# Patient Record
Sex: Female | Born: 2000 | Race: Black or African American | Hispanic: No | Marital: Single | Smoking: Never smoker
Health system: Southern US, Community
[De-identification: ages and names within clinical notes are randomized; demographics above are authoritative.]

## PROBLEM LIST (undated history)

## (undated) DIAGNOSIS — J302 Other seasonal allergic rhinitis: Secondary | ICD-10-CM

## (undated) DIAGNOSIS — B9689 Other specified bacterial agents as the cause of diseases classified elsewhere: Secondary | ICD-10-CM

## (undated) HISTORY — PX: ROOT CANAL: SHX2363

---
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
## 2018-01-11 ENCOUNTER — Encounter: Payer: Self-pay | Admitting: Internal Medicine

## 2018-01-11 ENCOUNTER — Encounter (INDEPENDENT_AMBULATORY_CARE_PROVIDER_SITE_OTHER): Payer: Self-pay

## 2018-01-11 ENCOUNTER — Ambulatory Visit (INDEPENDENT_AMBULATORY_CARE_PROVIDER_SITE_OTHER): Payer: No Typology Code available for payment source | Admitting: Internal Medicine

## 2018-01-11 ENCOUNTER — Other Ambulatory Visit: Payer: Self-pay

## 2018-01-11 VITALS — BP 122/51 | HR 61 | Temp 98.3°F | Ht 64.0 in | Wt 143.4 lb

## 2018-01-11 DIAGNOSIS — Z025 Encounter for examination for participation in sport: Secondary | ICD-10-CM | POA: Diagnosis not present

## 2018-01-11 DIAGNOSIS — J302 Other seasonal allergic rhinitis: Secondary | ICD-10-CM

## 2018-01-11 NOTE — Progress Notes (Addendum)
   CC: Physical Exam  HPI:  [name] May is a 17 y.o. 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 She denies any past medical history, injuries or childhood illnesses. She has no complaints or symptoms. She is in her second year of college. She denies any drug or alcohol use. She has never smoked tobacco products. She stated her periods are normal once a month and she does not have heavy bleeding. She takes zyrtec for her allergies PRN.    Past Medical History:  Diagnosis Date  . Premature baby    born at 32 weeks, 1 week NICU stay  . Seasonal allergies    Review of Systems:  Review of Systems  Constitutional: Negative for chills, fever and weight loss.  HENT: Negative for ear pain, hearing loss and sore throat.   Eyes: Negative for blurred vision.  Respiratory: Negative for cough, shortness of breath and wheezing.   Cardiovascular: Negative for chest pain and palpitations.  Gastrointestinal: Negative for abdominal pain and nausea.  Genitourinary: Negative for dysuria, frequency and urgency.  Musculoskeletal: Negative for joint pain and myalgias.  Skin: Negative for rash.  Neurological: Negative for weakness and headaches.    Physical Exam:  Vitals:   01/11/18 1514  BP: (!) 122/51  Pulse: 61  Temp: 98.3 F (36.8 C)  TempSrc: Oral  SpO2: 100%  Weight: 65 kg (143 lb 6.4 oz)  Height: 5\' 4"  (1.626 m)   Physical Exam  Constitutional: She is oriented to person, place, and time and well-developed, well-nourished, and in no distress.  HENT:  Head: Normocephalic and atraumatic.  Right Ear: External ear normal.  Left Ear: External ear normal.  Nose: Nose normal.  Eyes: Pupils are equal, round, and reactive to light. Conjunctivae and EOM are normal.  Neck: Normal range of motion. No thyromegaly present.  Cardiovascular: Normal rate, regular rhythm, normal heart sounds and intact distal pulses. Exam reveals no gallop.  No murmur  heard. Pulmonary/Chest: Effort normal and breath sounds normal. No respiratory distress. She has no wheezes.  Abdominal: Soft. Bowel sounds are normal. She exhibits no distension.  Musculoskeletal: Normal range of motion.  Lymphadenopathy:    She has no cervical adenopathy.  Neurological: She is alert and oriented to person, place, and time. Gait normal.  Skin: No rash noted.  Psychiatric: Affect and judgment normal.    Assessment & Plan:   Ms. Tanya May May is an 17 y.o female with a normal physical exam, with no active problems. She will have labs and HIV screening at her next visit.   Patient was discussed with Dr. Cyndie ChimeGranfortuna.

## 2018-01-11 NOTE — Addendum Note (Signed)
Addended by: Jaci StandardEHMAN, Rue Tinnel N on: 01/11/2018 04:40 PM   Modules accepted: Level of Service

## 2018-01-11 NOTE — Progress Notes (Signed)
Medicine attending: Medical history, presenting problems, physical findings, and medications, reviewed with resident physician Dr Areeg Rehman Lerry Lineron the day of the patient visit and I concur with her evaluation and management plan. Due to a communication error, patient left office before I could see her. Healthy 17 y/o woman here for a routine exam. She uses as needed antihistamines for seasonal allergies. No other med/surg problems. Normal exam per Dr Karilyn Cotaehman. She will return for screening labs.

## 2018-01-20 ENCOUNTER — Telehealth: Payer: Self-pay | Admitting: Internal Medicine

## 2018-02-09 ENCOUNTER — Ambulatory Visit: Payer: No Typology Code available for payment source

## 2018-03-20 ENCOUNTER — Encounter (HOSPITAL_COMMUNITY): Payer: Self-pay | Admitting: *Deleted

## 2018-03-20 ENCOUNTER — Inpatient Hospital Stay (HOSPITAL_COMMUNITY)
Admission: AD | Admit: 2018-03-20 | Discharge: 2018-03-20 | Disposition: A | Discharge status: Home / Self Care | Payer: No Typology Code available for payment source | Source: Ambulatory Visit | Attending: Obstetrics & Gynecology | Admitting: Obstetrics & Gynecology

## 2018-03-20 ENCOUNTER — Other Ambulatory Visit: Payer: Self-pay

## 2018-03-20 DIAGNOSIS — N760 Acute vaginitis: Secondary | ICD-10-CM | POA: Diagnosis not present

## 2018-03-20 DIAGNOSIS — B9689 Other specified bacterial agents as the cause of diseases classified elsewhere: Secondary | ICD-10-CM | POA: Diagnosis not present

## 2018-03-20 DIAGNOSIS — Z880 Allergy status to penicillin: Secondary | ICD-10-CM | POA: Insufficient documentation

## 2018-03-20 DIAGNOSIS — N898 Other specified noninflammatory disorders of vagina: Secondary | ICD-10-CM | POA: Diagnosis present

## 2018-03-20 HISTORY — DX: Other specified bacterial agents as the cause of diseases classified elsewhere: B96.89

## 2018-03-20 LAB — URINALYSIS, ROUTINE W REFLEX MICROSCOPIC
Bilirubin Urine: NEGATIVE
GLUCOSE, UA: NEGATIVE mg/dL
HGB URINE DIPSTICK: NEGATIVE
Ketones, ur: NEGATIVE mg/dL
Leukocytes, UA: NEGATIVE
Nitrite: NEGATIVE
PROTEIN: NEGATIVE mg/dL
Specific Gravity, Urine: 1.029 (ref 1.005–1.030)
pH: 5 (ref 5.0–8.0)

## 2018-03-20 LAB — WET PREP, GENITAL
Sperm: NONE SEEN
Trich, Wet Prep: NONE SEEN
Yeast Wet Prep HPF POC: NONE SEEN

## 2018-03-20 LAB — POCT PREGNANCY, URINE: Preg Test, Ur: NEGATIVE

## 2018-03-20 MED ORDER — METRONIDAZOLE 0.75 % VA GEL: 1.0000 | Freq: Every day | VAGINAL | 0 refills | Status: AC

## 2018-03-20 NOTE — MAU Provider Note (Signed)
History     CSN: [id number]  Arrival date and time: 03/20/18 1316   First Provider Initiated Contact with Patient 03/20/18 1616      Chief Complaint  Patient presents with  . Vaginal Discharge   HPI  Ms.  Tanya May is a 17 y.o. year old G0P0000 female at Unknown weeks gestation who presents to MAU reporting she thinks she has BV or yeast infection. She reports she was given an abx for an eye infection at the end of July. Since then she has had a vaginal d/c that she tried to treat with OTC yeast creams without relief. She reports that now the vaginal d/c has an odor. She denies abdominal pain, VB urinary sx's, no new sexual partners.   Past Medical History:  Diagnosis Date  . Premature baby    born at 32 weeks, 1 week NICU stay  . Seasonal allergies     Past Surgical History:  Procedure Laterality Date  . ROOT CANAL      Family History  Problem Relation Age of Onset  . Diabetes Maternal Grandmother   . Heart disease Maternal Grandmother   . Diabetes Maternal Grandfather   . Stroke Maternal Aunt     Social History   Tobacco Use  . Smoking status: Never Smoker  . Smokeless tobacco: Never Used  Substance Use Topics  . Alcohol use: No  . Drug use: No    Allergies:  Allergies  Allergen Reactions  . Adhesive [Tape] Itching    Please use paper tape  . Amoxicillin Hives    Reaction at 17 yrs old Has patient had a PCN reaction causing immediate rash, facial/tongue/throat swelling, SOB or lightheadedness with hypotension: Yes Has patient had a PCN reaction causing severe rash involving mucus membranes or skin necrosis: No Has patient had a PCN reaction that required hospitalization: No Has patient had a PCN reaction occurring within the last 10 years: Yes If all of the above answers are "NO", then may proceed with Cephalosporin use.   . Latex Rash  . Penicillins Rash    Has patient had a PCN reaction causing immediate rash, facial/tongue/throat swelling,  SOB or lightheadedness with hypotension: Yes Has patient had a PCN reaction causing severe rash involving mucus membranes or skin necrosis: No Has patient had a PCN reaction that required hospitalization No Has patient had a PCN reaction occurring within the last 10 years: Yes If all of the above answers are "NO", then may proceed with Cephalosporin use.    Medications Prior to Admission  Medication Sig Dispense Refill Last Dose  . cetirizine (ZYRTEC) 10 MG tablet Take 10 mg by mouth at bedtime.   10/13/2017 at Unknown time  . CVS BIOTIN PO Take 1 capsule by mouth daily.   10/13/2017 at Unknown time  . meclizine (ANTIVERT) 25 MG tablet Take 1 tablet (25 mg total) by mouth 3 (three) times daily as needed for dizziness. 30 tablet 0   . norethindrone-ethinyl estradiol (JUNEL FE,GILDESS FE,LOESTRIN FE) 1-20 MG-MCG tablet Take 1 tablet by mouth daily.   08/22/2017 at Unknown time    Review of Systems  Constitutional: Negative.   HENT: Negative.   Eyes: Negative.   Respiratory: Negative.   Cardiovascular: Negative.   Gastrointestinal: Negative.   Endocrine: Negative.   Genitourinary: Positive for vaginal discharge (increased and odor).  Musculoskeletal: Negative.   Skin: Negative.   Allergic/Immunologic: Negative.   Neurological: Negative.   Hematological: Negative.   Psychiatric/Behavioral: Negative.  Physical Exam   Blood pressure (!) 104/59, pulse 81, temperature 98.4 F (36.9 C), temperature source Oral, resp. rate 16, weight 62.3 kg, last menstrual period 03/05/2018, SpO2 98 %.  Physical Exam  Nursing note and vitals reviewed. Constitutional: She is oriented to person, place, and time. She appears well-developed and well-nourished.  HENT:  Head: Normocephalic and atraumatic.  Eyes: Pupils are equal, round, and reactive to light.  Neck: Normal range of motion.  Cardiovascular: Normal rate, regular rhythm and normal heart sounds.  Respiratory: Effort normal and breath sounds  normal.  GI: Soft. Bowel sounds are normal.  Genitourinary:  Genitourinary Comments: Uterus: non-tender, SE: cervix is smooth, pink, no lesions, moderate amt of malodorous, thin, white vaginal d/c -- WP, GC/CT done, closed/long/firm, no CMT or friability, no adnexal tenderness   Musculoskeletal: Normal range of motion.  Neurological: She is alert and oriented to person, place, and time.  Skin: Skin is warm and dry.  Psychiatric: She has a normal mood and affect. Her behavior is normal. Judgment and thought content normal.    MAU Course  Procedures  MDM CCUA UPT Wet Prep GC/CT -- pending  Results for orders placed or performed during the hospital encounter of 03/20/18 (from the past 24 hour(s))  Urinalysis, Routine w reflex microscopic     Status: Abnormal   Collection Time: 03/20/18  2:09 PM  Result Value Ref Range   Color, Urine YELLOW YELLOW   APPearance CLOUDY (A) CLEAR   Specific Gravity, Urine 1.029 1.005 - 1.030   pH 5.0 5.0 - 8.0   Glucose, UA NEGATIVE NEGATIVE mg/dL   Hgb urine dipstick NEGATIVE NEGATIVE   Bilirubin Urine NEGATIVE NEGATIVE   Ketones, ur NEGATIVE NEGATIVE mg/dL   Protein, ur NEGATIVE NEGATIVE mg/dL   Nitrite NEGATIVE NEGATIVE   Leukocytes, UA NEGATIVE NEGATIVE  Pregnancy, urine POC     Status: None   Collection Time: 03/20/18  2:13 PM  Result Value Ref Range   Preg Test, Ur NEGATIVE NEGATIVE  Wet prep, genital     Status: Abnormal   Collection Time: 03/20/18  4:25 PM  Result Value Ref Range   Yeast Wet Prep HPF POC NONE SEEN NONE SEEN   Trich, Wet Prep NONE SEEN NONE SEEN   Clue Cells Wet Prep HPF POC PRESENT (A) NONE SEEN   WBC, Wet Prep HPF POC MODERATE (A) NONE SEEN   Sperm NONE SEEN     Assessment and Plan  Bacterial vaginitis - Plan: Discharge patient - Rx for Metrogel 0.75% cream pv hs x 5 days - Information provided on BV - 2020 MAU GYN changes information provided - [place] [facility] provider list given - Contact info on [facility] provided for her to call for further GYN needs  - Patient verbalized an understanding of the plan of care and agrees.   Raelyn Moraolitta Haizlee Henton, MSN, CNM 03/20/2018, 4:16 PM

## 2018-03-20 NOTE — Discharge Instructions (Signed)
In late February 2020, the [facility] will be [facility]. At that time, the MAU (Maternity Admissions Unit), where you are being seen today, will no longer take care of non-pregnant patients. We strongly encourage you to find a doctor's office before that time, so that you can be seen with any GYN concerns, like vaginal discharge, urinary tract infection, etc.. in a timely manner.  In order to make an office visit more convenient, the Center for [facility] at [facility] will be offering evening hours with same-day appointments, walk-in appointments and scheduled appointments available during this time.  Center for [facility] @ [facility] Hours: Monday - 8am - 7:30 pm with walk-in between 4pm- 7:30 pm Tuesday - 8 am - 5 pm open late and accepting walk-ins from 4pm - 7:30pm Wednesday - 8 am - 5 pm open late and accepting walk-ins from 4pm - 7:30pm Thursday 8 am - 5 pm  Friday 8 am - 5 pm  For an appointment please call the Center for [facility] @ [facility] at [phone]  For urgent needs, [facility] is also available for management of urgent GYN complaints such as vaginal discharge or urinary tract infections.   [facility]    [facility] for [facility]       Phone: [phone]  [facility] [place] Phone: [phone]  [facility] [place]  Phone: [phone]  [facility]  Phone: [phone]  Center for [facility] [place]  Phone: [phone]  [facility]                  Phone: [phone]  [facility]       Phone: [phone]  [facility]    Phone: [phone]   [facility] [place]    Phone: [phone]  [facility]    Phone:  [phone]  [facility]    Phone: [phone]  [facility]    Phone: [phone]  [facility]       Phone: [phone]   [facility]  Phone: [phone]  [facility]    Phone: [phone]  [facility]   Phone: [phone]  [facility]      Phone: [phone]  [facility]    Phone: [phone]

## 2018-03-20 NOTE — MAU Note (Signed)
Was given antibiotics for an eye infection.  Thinks it caused a yeast infection.  Tried home remedies, now is thinking it may be a bacterial infections. Having a d/c with odor

## 2018-03-21 LAB — GC/CHLAMYDIA PROBE AMP (~~LOC~~) NOT AT ARMC
Chlamydia: NEGATIVE
Neisseria Gonorrhea: NEGATIVE

## 2018-11-13 ENCOUNTER — Other Ambulatory Visit: Payer: Self-pay

## 2018-11-13 ENCOUNTER — Ambulatory Visit (INDEPENDENT_AMBULATORY_CARE_PROVIDER_SITE_OTHER): Payer: Managed Care, Other (non HMO) | Admitting: Internal Medicine

## 2018-11-13 VITALS — BP 109/56 | HR 66 | Temp 98.4°F | Wt 144.7 lb

## 2018-11-13 DIAGNOSIS — R350 Frequency of micturition: Secondary | ICD-10-CM | POA: Diagnosis not present

## 2018-11-13 DIAGNOSIS — R3915 Urgency of urination: Secondary | ICD-10-CM | POA: Diagnosis not present

## 2018-11-13 LAB — POCT URINALYSIS DIPSTICK
Bilirubin, UA: NEGATIVE
Blood, UA: NEGATIVE
Glucose, UA: NEGATIVE
Ketones, UA: NEGATIVE
Nitrite, UA: NEGATIVE
Protein, UA: NEGATIVE
Spec Grav, UA: >=1.03 — AB (ref 1.010–1.025)
Urobilinogen, UA: 1 E.U./dL
pH, UA: 6 (ref 5.0–8.0)

## 2018-11-13 MED ORDER — SULFAMETHOXAZOLE-TRIMETHOPRIM 800-160 MG PO TABS: 1.0000 | ORAL_TABLET | Freq: Two times a day (BID) | ORAL | 0 refills | Status: AC

## 2018-11-13 NOTE — Progress Notes (Signed)
   CC: Increased urinary urgency and frequency for few days.  HPI:  Tanya May is a 18 y.o. with no significant past medical history came to the clinic with complaint of increased urinary urgency and frequency for the past few days.  She denies any abdominal pain, fever or chills.  Denies any vaginal discharge.  Denies any nocturia.  Denies any polydipsia.  She is having regular cycle, LMP was on November 02, 2018.  She is not sexually active at this time.  Past Medical History:  Diagnosis Date  . Premature baby    born at 32 weeks, 1 week NICU stay  . Seasonal allergies    Review of Systems: Negative except mentioned in HPI.  Physical Exam:  Vitals:   11/13/18 1018  BP: (!) 109/56  Pulse: 66  Temp: 98.4 F (36.9 C)  TempSrc: Oral  Weight: 144 lb 11.2 oz (65.6 kg)   General: Vital signs reviewed.  Patient is well-developed and well-nourished, in no acute distress and cooperative with exam.  Head: Normocephalic and atraumatic. Eyes: EOMI, conjunctivae normal, no scleral icterus.  Cardiovascular: RRR, S1 normal, S2 normal, no murmurs, gallops, or rubs. Pulmonary/Chest: Clear to auscultation bilaterally, no wheezes, rales, or rhonchi. Abdominal: Soft, non-tender, non-distended, BS +,  Extremities: No lower extremity edema bilaterally,  pulses symmetric and intact bilaterally. No cyanosis or clubbing. Skin: Warm, dry and intact. No rashes or erythema. Psychiatric: Normal mood and affect. speech and behavior is normal. Cognition and memory are normal.  Assessment & Plan:   See Encounters Tab for problem based charting.  Patient discussed with Dr. Cyndie Chime.

## 2018-11-13 NOTE — Patient Instructions (Addendum)
Thank you for visiting clinic today. It seems like you might be having irritation/infection of your bladder called cystitis. I am giving you a 3-day course of Bactrim you will take it twice a day for 3 days and see if that will improve your symptoms. Please call our clinic if your symptoms fail to resolve or get worse or if you develop fever or pain.    Urinary Tract Infection, Adult  A urinary tract infection (UTI) is an infection of any part of the urinary tract. The urinary tract includes the kidneys, ureters, bladder, and urethra. These organs make, store, and get rid of urine in the body. Your health care provider may use other names to describe the infection. An upper UTI affects the ureters and kidneys (pyelonephritis). A lower UTI affects the bladder (cystitis) and urethra (urethritis). What are the causes? Most urinary tract infections are caused by bacteria in your genital area, around the entrance to your urinary tract (urethra). These bacteria grow and cause inflammation of your urinary tract. What increases the risk? You are more likely to develop this condition if:  You have a urinary catheter that stays in place (indwelling).  You are not able to control when you urinate or have a bowel movement (you have incontinence).  You are female and you: ? Use a spermicide or diaphragm for birth control. ? Have low estrogen levels. ? Are pregnant.  You have certain genes that increase your risk (genetics).  You are sexually active.  You take antibiotic medicines.  You have a condition that causes your flow of urine to slow down, such as: ? An enlarged prostate, if you are female. ? Blockage in your urethra (stricture). ? A kidney stone. ? A nerve condition that affects your bladder control (neurogenic bladder). ? Not getting enough to drink, or not urinating often.  You have certain medical conditions, such as: ? Diabetes. ? A weak disease-fighting system  (immunesystem). ? Sickle cell disease. ? Gout. ? Spinal cord injury. What are the signs or symptoms? Symptoms of this condition include:  Needing to urinate right away (urgently).  Frequent urination or passing small amounts of urine frequently.  Pain or burning with urination.  Blood in the urine.  Urine that smells bad or unusual.  Trouble urinating.  Cloudy urine.  Vaginal discharge, if you are female.  Pain in the abdomen or the lower back. You may also have:  Vomiting or a decreased appetite.  Confusion.  Irritability or tiredness.  A fever.  Diarrhea. The first symptom in older adults may be confusion. In some cases, they may not have any symptoms until the infection has worsened. How is this diagnosed? This condition is diagnosed based on your medical history and a physical exam. You may also have other tests, including:  Urine tests.  Blood tests.  Tests for sexually transmitted infections (STIs). If you have had more than one UTI, a cystoscopy or imaging studies may be done to determine the cause of the infections. How is this treated? Treatment for this condition includes:  Antibiotic medicine.  Over-the-counter medicines to treat discomfort.  Drinking enough water to stay hydrated. If you have frequent infections or have other conditions such as a kidney stone, you may need to see a health care provider who specializes in the urinary tract (urologist). In rare cases, urinary tract infections can cause sepsis. Sepsis is a life-threatening condition that occurs when the body responds to an infection. Sepsis is treated in the hospital with  IV antibiotics, fluids, and other medicines. Follow these instructions at home:  Medicines  Take over-the-counter and prescription medicines only as told by your health care provider.  If you were prescribed an antibiotic medicine, take it as told by your health care provider. Do not stop using the antibiotic  even if you start to feel better. General instructions  Make sure you: ? Empty your bladder often and completely. Do not hold urine for long periods of time. ? Empty your bladder after sex. ? Wipe from front to back after a bowel movement if you are female. Use each tissue one time when you wipe.  Drink enough fluid to keep your urine pale yellow.  Keep all follow-up visits as told by your health care provider. This is important. Contact a health care provider if:  Your symptoms do not get better after 1-2 days.  Your symptoms go away and then return. Get help right away if you have:  Severe pain in your back or your lower abdomen.  A fever.  Nausea or vomiting. Summary  A urinary tract infection (UTI) is an infection of any part of the urinary tract, which includes the kidneys, ureters, bladder, and urethra.  Most urinary tract infections are caused by bacteria in your genital area, around the entrance to your urinary tract (urethra).  Treatment for this condition often includes antibiotic medicines.  If you were prescribed an antibiotic medicine, take it as told by your health care provider. Do not stop using the antibiotic even if you start to feel better.  Keep all follow-up visits as told by your health care provider. This is important. This information is not intended to replace advice given to you by your health care provider. Make sure you discuss any questions you have with your health care provider. Document Released: 03/31/2005 Document Revised: 12/29/2017 Document Reviewed: 12/29/2017 Elsevier Interactive Patient Education  2019 [employer]

## 2018-11-13 NOTE — Assessment & Plan Note (Signed)
Her symptoms were more consistent with uncomplicated cystitis.  Urine dipstick done in clinic shows small amount of leukocytes.  -Give her a 3-day course of Bactrim. -If her symptoms fail to resolve then she may need urine culture.

## 2018-11-14 NOTE — Progress Notes (Signed)
Medicine attending: Medical history, presenting problems, physical findings, and medications, reviewed with resident physician Dr Summaya Amin on the day of the patient visit and I concur with her evaluation and management plan. 

## 2018-12-31 ENCOUNTER — Encounter: Payer: Self-pay | Admitting: *Deleted

## 2019-02-20 ENCOUNTER — Ambulatory Visit: Payer: No Typology Code available for payment source | Admitting: Internal Medicine

## 2019-02-20 ENCOUNTER — Other Ambulatory Visit: Payer: Self-pay

## 2019-02-20 DIAGNOSIS — Z0000 Encounter for general adult medical examination without abnormal findings: Secondary | ICD-10-CM | POA: Insufficient documentation

## 2019-02-20 NOTE — Progress Notes (Signed)
   CC: Annual physical for school  HPI:  [name] is a 18 y.o. with medical history of sickle cell trait presenting to have an annual physical to return to band practice.  Please see problem based charting for further details  Past Medical History:  Diagnosis Date  . Bacterial vaginitis 03/20/2018  . Premature baby    born at 25 weeks, 1 week NICU stay  . Seasonal allergies    Review of Systems: As per HPI  Physical Exam:  Vitals:   02/20/19 1344  BP: 119/63  Pulse: 85  Temp: 98.3 F (36.8 C)  TempSrc: Oral  SpO2: 100%  Weight: 140 lb 11.2 oz (63.8 kg)  Height: 5\' 5"  (1.651 m)   Physical Exam  Constitutional: She is oriented to person, place, and time and well-developed, well-nourished, and in no distress.  HENT:  Head: Normocephalic and atraumatic.  Right Ear: External ear normal.  Left Ear: External ear normal.  Mouth/Throat: Oropharynx is clear and moist. No oropharyngeal exudate.  Eyes: Conjunctivae are normal.  Neck: Neck supple.  Cardiovascular: Normal rate, regular rhythm and normal heart sounds.  Pulmonary/Chest: Effort normal and breath sounds normal. No respiratory distress. She has no wheezes. She has no rales.  Abdominal: Soft. Bowel sounds are normal. She exhibits no distension. There is no abdominal tenderness.  Musculoskeletal: Normal range of motion.        General: No tenderness, deformity or edema.  Neurological: She is alert and oriented to person, place, and time. No cranial nerve deficit. Gait normal. Coordination normal.  Skin: Skin is warm. No rash noted. No erythema. No pallor.  Psychiatric: Mood and affect normal.    Assessment & Plan:   See Encounters Tab for problem based charting.  Patient discussed with Dr. Evette Doffing

## 2019-02-20 NOTE — Assessment & Plan Note (Signed)
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  She is here to request an annual physical performed prior to returning to band practice.  She is otherwise very healthy young woman with no significant past medical history.  She denies chest pain, shortness of breath, nausea, vomiting, headache, dizziness, lightheadedness.  She works at [employer] and denies alcohol use, cigarette use, illicit drug use or being sexually active.  She uses seatbelt when she drives and endorses eating healthy diet and exercising.  She denies depression or birth control use.  Plan: - Annual wellness form filled -Advised on seatbelt use, living a healthy lifestyle.

## 2019-02-20 NOTE — Patient Instructions (Signed)
Ms. Depaoli,   It was a pleasure taking care of you at the clinic today.  Overall you seem to be doing very well and I would encourage you to continue taking care of yourself.  Please adhere to all the advice that I provided you during the close of our encounter.  Take care! Dr. Eileen Stanford  Please call the internal medicine center clinic if you have any questions or concerns, we may be able to help and keep you from a long and expensive emergency room wait. Our clinic and after hours phone number is [phone], the best time to call is Monday through Friday 9 am to 4 pm but there is always someone available 24/7 if you have an emergency. If you need medication refills please notify your pharmacy one week in advance and they will send [place] a request.

## 2019-02-21 NOTE — Progress Notes (Signed)
Internal Medicine Clinic Attending  Case discussed with Dr. Agyei at the time of the visit.  We reviewed the resident's history and exam and pertinent patient test results.  I agree with the assessment, diagnosis, and plan of care documented in the resident's note.    

## 2019-03-13 ENCOUNTER — Other Ambulatory Visit: Payer: Self-pay

## 2019-03-13 ENCOUNTER — Other Ambulatory Visit: Payer: Self-pay | Admitting: Internal Medicine

## 2019-03-13 DIAGNOSIS — Z0000 Encounter for general adult medical examination without abnormal findings: Secondary | ICD-10-CM

## 2019-03-13 DIAGNOSIS — Z20822 Contact with and (suspected) exposure to covid-19: Secondary | ICD-10-CM

## 2019-03-15 LAB — NOVEL CORONAVIRUS, NAA: SARS-CoV-2, NAA: NOT DETECTED

## 2019-03-20 ENCOUNTER — Other Ambulatory Visit: Payer: Self-pay | Admitting: Registered"

## 2019-03-20 DIAGNOSIS — Z20822 Contact with and (suspected) exposure to covid-19: Secondary | ICD-10-CM

## 2019-03-22 LAB — NOVEL CORONAVIRUS, NAA: SARS-CoV-2, NAA: NOT DETECTED

## 2019-07-25 ENCOUNTER — Other Ambulatory Visit: Payer: Managed Care, Other (non HMO)

## 2019-07-26 ENCOUNTER — Other Ambulatory Visit: Payer: Managed Care, Other (non HMO)

## 2019-07-30 ENCOUNTER — Ambulatory Visit: Payer: Managed Care, Other (non HMO) | Attending: Internal Medicine

## 2019-07-30 DIAGNOSIS — Z20822 Contact with and (suspected) exposure to covid-19: Secondary | ICD-10-CM

## 2019-07-31 LAB — NOVEL CORONAVIRUS, NAA: SARS-CoV-2, NAA: NOT DETECTED

## 2019-10-04 ENCOUNTER — Ambulatory Visit: Payer: Managed Care, Other (non HMO) | Attending: Internal Medicine

## 2019-10-04 DIAGNOSIS — Z23 Encounter for immunization: Secondary | ICD-10-CM

## 2019-10-04 NOTE — Progress Notes (Signed)
   Covid-19 Vaccination Clinic  Name:  Tanya May    MRN: [id number] DOB: 04/04/2001  10/04/2019  Tanya May was observed post Covid-19 immunization for 15 minutes without incident. She was provided with Vaccine Information Sheet and instruction to access the V-Safe system.   Tanya May was instructed to call 911 with any severe reactions post vaccine: Marland Kitchen Difficulty breathing  . Swelling of face and throat  . A fast heartbeat  . A bad rash all over body  . Dizziness and weakness   Immunizations Administered    Name Date Dose VIS Date Route   Pfizer COVID-19 Vaccine 10/04/2019 11:10 AM 0.3 mL 06/15/2019 Intramuscular   Manufacturer: [employer], [employer]   Lot: [id number]   NDC: [address]-5

## 2019-10-10 ENCOUNTER — Other Ambulatory Visit: Payer: Managed Care, Other (non HMO)

## 2019-10-24 ENCOUNTER — Encounter: Payer: Self-pay | Admitting: *Deleted

## 2019-10-29 ENCOUNTER — Ambulatory Visit: Payer: Managed Care, Other (non HMO) | Attending: Internal Medicine

## 2019-10-29 DIAGNOSIS — Z23 Encounter for immunization: Secondary | ICD-10-CM

## 2019-10-29 NOTE — Progress Notes (Signed)
   Covid-19 Vaccination Clinic  Name:  KEVONNA NOLTE    MRN: [id number] DOB: 2001-04-09  10/29/2019  Ms. Danzy was observed post Covid-19 immunization for 15 minutes without incident. She was provided with Vaccine Information Sheet and instruction to access the V-Safe system.   Ms. Kawasaki was instructed to call 911 with any severe reactions post vaccine: Marland Kitchen Difficulty breathing  . Swelling of face and throat  . A fast heartbeat  . A bad rash all over body  . Dizziness and weakness   Immunizations Administered    Name Date Dose VIS Date Route   Pfizer COVID-19 Vaccine 10/29/2019 11:03 AM 0.3 mL 08/29/2018 Intramuscular   Manufacturer: [employer], [employer]   Lot: [id number]   NDC: [address]-2

## 2019-12-06 ENCOUNTER — Encounter: Payer: Managed Care, Other (non HMO) | Admitting: Internal Medicine

## 2019-12-13 ENCOUNTER — Encounter: Payer: Managed Care, Other (non HMO) | Admitting: Internal Medicine

## 2019-12-20 ENCOUNTER — Other Ambulatory Visit: Payer: Self-pay

## 2019-12-20 ENCOUNTER — Ambulatory Visit (INDEPENDENT_AMBULATORY_CARE_PROVIDER_SITE_OTHER): Payer: Managed Care, Other (non HMO) | Admitting: Internal Medicine

## 2019-12-20 ENCOUNTER — Encounter: Payer: Self-pay | Admitting: Internal Medicine

## 2019-12-20 VITALS — BP 106/51 | HR 83 | Temp 99.6°F | Ht 65.0 in | Wt 142.0 lb

## 2019-12-20 DIAGNOSIS — L810 Postinflammatory hyperpigmentation: Secondary | ICD-10-CM | POA: Diagnosis not present

## 2019-12-20 DIAGNOSIS — Z0000 Encounter for general adult medical examination without abnormal findings: Secondary | ICD-10-CM | POA: Diagnosis not present

## 2019-12-20 NOTE — Patient Instructions (Signed)
Tanya May,  It was nice meeting you today!  The rash over your left elbow is likely residual hyperpigmentation after an inflammatory dermatitis (or skin irritation). I am glad it is improving. If it returns, worsens, or spreads elsewhere, then please follow-up back in our office.  Your physical form was completed and given back to you today. Good luck with band in the fall!  Thank you for letting me be a part of your care!

## 2019-12-20 NOTE — Progress Notes (Signed)
   CC: annual physical exam  HPI:  [name] is a 19 y.o. F with past medical history as outlined below, who presents for a rash and an annual physical. Please see problem-based charting for additional information.  Past Medical History:  Diagnosis Date  . Bacterial vaginitis 03/20/2018  . Premature baby    born at 32 weeks, 1 week NICU stay  . Seasonal allergies    Review of Systems:   Review of Systems  Constitutional: Negative for chills and fever.  HENT: Negative for congestion, hearing loss and sore throat.   Eyes: Negative for blurred vision and double vision.  Respiratory: Negative for cough and shortness of breath.   Cardiovascular: Negative for chest pain, palpitations and leg swelling.  Gastrointestinal: Negative for abdominal pain, constipation, diarrhea, nausea and vomiting.  Genitourinary: Negative for dysuria.  Musculoskeletal: Negative for back pain, joint pain and myalgias.  Skin: Positive for rash (over left elbow).  Neurological: Negative for dizziness and headaches.   Physical Exam:  Vitals:   12/20/19 1357  BP: (!) 106/51  Pulse: (!) 108  Temp: 99.6 F (37.6 C)  TempSrc: Oral  SpO2: 98%  Weight: 142 lb (64.4 kg)  Height: 5\' 5"  (1.651 m)   Physical Exam Vitals and nursing note reviewed.  Constitutional:      General: She is not in acute distress.    Appearance: Normal appearance. She is normal weight. She is not ill-appearing.  HENT:     Head: Normocephalic and atraumatic.     Right Ear: Tympanic membrane, ear canal and external ear normal.     Left Ear: Tympanic membrane, ear canal and external ear normal.     Nose: Congestion present.     Mouth/Throat:     Mouth: Mucous membranes are moist.     Pharynx: No posterior oropharyngeal erythema.  Eyes:     Extraocular Movements: Extraocular movements intact.     Conjunctiva/sclera: Conjunctivae normal.     Pupils: Pupils are equal, round, and reactive to light.  Cardiovascular:      Rate and Rhythm: Normal rate and regular rhythm.     Heart sounds: Normal heart sounds.  Pulmonary:     Effort: Pulmonary effort is normal.     Breath sounds: Normal breath sounds. No wheezing, rhonchi or rales.  Abdominal:     General: Abdomen is flat. Bowel sounds are normal.     Palpations: Abdomen is soft.  Genitourinary:    Comments: Deferred Musculoskeletal:        General: No swelling, tenderness or deformity. Normal range of motion.  Skin:    General: Skin is warm and dry.     Comments: 2x3cm area of flat hyperpigmentation around pt's left elbow (pictured below).  Neurological:     General: No focal deficit present.     Mental Status: She is alert and oriented to person, place, and time. Mental status is at baseline.  Psychiatric:        Mood and Affect: Mood normal.       Assessment & Plan:   See Encounters Tab for problem based charting.  Patient discussed with Dr. 

## 2019-12-20 NOTE — Assessment & Plan Note (Signed)
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 She will begin two-a-day practices at [employer] at the end of July. She is without any significant PMH. Only takes Zyrtec for seasonal allergies. ROS entirely negative. Exam benign. Stopped previous OCPs due to intermittent spotting. Discussed options for other contraception methods, and pt requests a referral to Gyn "so I can see one."  - school physical form completed (with sickle cell screening result from 2018) - will refer to Gyn - f/u in one year

## 2019-12-20 NOTE — Assessment & Plan Note (Signed)
Pt developed rash over L elbow 2-3 weeks ago. States it started as a raised bumpy rash that was erythematous and pruritic. She used hydrocortisone cream at home which improved the itchiness. Now, pt describes the rash as flattening and becoming dark. No longer itching. No other lesions over her body. Overall, believes the area is decreasing in size and the color changes are improving. Pt does endorse skin that is sensitive to changes in soaps and detergents in addition to fragrance and irritating fabrics.   Assessment - hyperpigmentation of left elbow after inflammatory dermatitis  - rash site improvement - no need for additional steroid cream at this time - advised pt to follow-up if rash worsening, return, or spreading

## 2019-12-21 NOTE — Progress Notes (Signed)
Internal Medicine Clinic Attending  Case discussed with Dr. Jones at the time of the visit.  We reviewed the resident's history and exam and pertinent patient test results.  I agree with the assessment, diagnosis, and plan of care documented in the resident's note.  

## 2020-01-16 ENCOUNTER — Telehealth: Payer: Self-pay | Admitting: *Deleted

## 2020-01-16 NOTE — Telephone Encounter (Signed)
CALL [facility] FOR STATUS OF REFERRAL / OFFICE TO CONTACT PATIENT FOR SCHEDULING.

## 2020-01-22 ENCOUNTER — Encounter: Payer: Self-pay | Admitting: *Deleted

## 2020-03-05 ENCOUNTER — Ambulatory Visit: Payer: Managed Care, Other (non HMO) | Admitting: Obstetrics and Gynecology

## 2020-03-05 NOTE — Addendum Note (Signed)
Addended by: Neomia Dear on: 03/05/2020 06:23 PM   Modules accepted: Orders

## 2020-04-11 ENCOUNTER — Encounter: Payer: Medicaid Other | Admitting: Internal Medicine

## 2020-07-29 ENCOUNTER — Ambulatory Visit: Payer: Medicaid Other

## 2020-09-25 ENCOUNTER — Encounter: Payer: Medicaid Other | Admitting: Student

## 2020-09-26 ENCOUNTER — Encounter: Payer: Medicaid Other | Admitting: Student

## 2021-01-06 ENCOUNTER — Encounter: Payer: Self-pay | Admitting: *Deleted

## 2021-01-21 ENCOUNTER — Ambulatory Visit (HOSPITAL_COMMUNITY): Payer: Self-pay | Admitting: Student

## 2021-01-21 ENCOUNTER — Other Ambulatory Visit: Payer: Self-pay

## 2021-01-21 ENCOUNTER — Encounter: Payer: Self-pay | Admitting: Student

## 2021-01-21 VITALS — BP 115/63 | HR 108 | Temp 98.3°F | Wt 121.9 lb

## 2021-01-21 DIAGNOSIS — S6000XA Contusion of unspecified finger without damage to nail, initial encounter: Secondary | ICD-10-CM | POA: Insufficient documentation

## 2021-01-21 DIAGNOSIS — S60021A Contusion of right index finger without damage to nail, initial encounter: Secondary | ICD-10-CM

## 2021-01-21 DIAGNOSIS — L810 Postinflammatory hyperpigmentation: Secondary | ICD-10-CM

## 2021-01-21 DIAGNOSIS — Z0000 Encounter for general adult medical examination without abnormal findings: Secondary | ICD-10-CM

## 2021-01-21 NOTE — Assessment & Plan Note (Addendum)
Tanya May reports that she was playing softball yesterday when she caught the ball bare-handed with her right hand. Mentions that there was bruising afterward and it is still tender, but improving from yesterday. States she has been icing the area which has eased the pain and bruising.  On examination, there is a mild contusion at the tip of her second digit on the right hand without damage to nail. Mild pain elicited with flexion and extension of DIP, but otherwise ROM in tact. Likely just mild contusion that should improve with time. No indication for imaging or further work-up. Discussed with Tanya May she should continue icing the area and take ibuprofen or Tylenol as needed.  - Continue icing area - Ibuprofen 200mg  q6h PRN

## 2021-01-21 NOTE — Assessment & Plan Note (Addendum)
Ms. Dorrough presents today with similar raised rash on left elbow. This is present over anterior portion of elbow and comes and goes. She mentions that when she takes Zyrtec this usually clears her skin up. Denies pruritis or pain. Denies recent changes in soaps or detergents.  I have encouraged Ms. Wilden to continue with Zyrtec for this. If this continues to persist, can consider referral to allergist for further evaluation.  - Zyrtec 10mg  daily - Recommend fragrance free Dove soap

## 2021-01-21 NOTE — Patient Instructions (Signed)
[name], it was a pleasure seeing you today!  Today we discussed: - Finger injury: Continue icing your finger daily. In addition, you can take ibuprofen or tylenol as needed for pain.  - I have referred you to an OBGYN  Referrals ordered today:    Referral Orders  Ambulatory referral to Obstetrics / Gynecology    Follow-up:  1 year    Please make sure to arrive 15 minutes prior to your next appointment. If you arrive late, you may be asked to reschedule.   We look forward to seeing you next time. Please call our clinic at [phone] if you have any questions or concerns. The best time to call is Monday-Friday from 9am-4pm, but there is someone available 24/7. If after hours or the weekend, call the main hospital number and ask for the Internal Medicine Resident On-Call. If you need medication refills, please notify your pharmacy one week in advance and they will send [place] a request.  Thank you for letting us take part in your care. Wishing you the best!  Thank you, Evlyn Kanner, MD

## 2021-01-21 NOTE — Progress Notes (Signed)
   CC: finger injury, annual visit  HPI:  Tanya May is a 20 y.o. with medical history as below presenting to [facility] for finger injury, annual visit.  Please see problem-based list for further details, assessments, and plans.  Past Medical History:  Diagnosis Date   Bacterial vaginitis 03/20/2018   Premature baby    born at 32 weeks, 1 week NICU stay   Seasonal allergies    Review of Systems:  As per HPI  Physical Exam:  Vitals:   01/21/21 1421  BP: 115/63  Pulse: (!) 108  Temp: 98.3 F (36.8 C)  TempSrc: Oral  SpO2: 100%  Weight: 121 lb 14.4 oz (55.3 kg)   General: Well-developed, well-nourished. No acute distress CV: Regular rate, rhythm. No murmurs, rubs, gallops appreciated. Warm extremities. Pulm: Normal work of breathing on room air. Clear to auscultation bilaterally. MSK: Normal bulk, tone. No pitting edema bilaterally. Second digit on right hand with mild contusion distal to DIP. MCP, PIP, DIP joints palpated without pain. Mild pain elicited with flexion and extension of DIP on second digit on right hand. Nailbed in tact. Skin: Warm, dry. Hyperpigmented, raised area on anterior portion of left elbow. Neuro: Awake, alert, conversing appropriately. No focal deficits. Psych: Normal mood, affect, speech.  Assessment & Plan:   See Encounters Tab for problem based charting.  Patient discussed with Dr. Heide Spark

## 2021-01-21 NOTE — Assessment & Plan Note (Addendum)
Tanya May is presenting for her annual follow-up visit today. 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 Notes that things overall have been going very well and she has no new complaints today, outside of her finger injury. Denies feelings of depression, including SI/HI. PHQ-9 0. Full review of systems negative. Denies history of tobacco use. Mentions occasional alcohol use. Denies recreational drug use.  She mentions that she had a PAP smear done roughly 1-2 years ago. We discussed HPV vaccines and Tdap, she is electing to hold off at this time. She is currently not sexually active. Tanya May is request an OBGYN appointment for continued care including PAP smears.   Tanya May was initially tachycardic on arrival but HR improved upon examination. Today will place referral for OBGYN and have her follow-up with the clinic in one year.  - OBGYN referral placed - Follow-up with the clinic in one year

## 2021-01-22 NOTE — Progress Notes (Signed)
Internal Medicine Clinic Attending ? ?Case discussed with Dr. Braswell  At the time of the visit.  We reviewed the resident?s history and exam and pertinent patient test results.  I agree with the assessment, diagnosis, and plan of care documented in the resident?s note.  ?

## 2021-08-20 ENCOUNTER — Encounter: Payer: Medicaid Other | Admitting: Internal Medicine

## 2022-03-26 ENCOUNTER — Other Ambulatory Visit: Payer: Self-pay

## 2022-03-26 ENCOUNTER — Encounter: Payer: Self-pay | Admitting: Student

## 2022-03-26 ENCOUNTER — Encounter (HOSPITAL_BASED_OUTPATIENT_CLINIC_OR_DEPARTMENT_OTHER): Payer: Self-pay | Admitting: Emergency Medicine

## 2022-03-26 ENCOUNTER — Emergency Department (HOSPITAL_BASED_OUTPATIENT_CLINIC_OR_DEPARTMENT_OTHER)
Admission: EM | Admit: 2022-03-26 | Discharge: 2022-03-26 | Disposition: A | Discharge status: Home / Self Care | Payer: Managed Care, Other (non HMO) | Attending: Emergency Medicine | Admitting: Emergency Medicine

## 2022-03-26 DIAGNOSIS — Z91040 Latex allergy status: Secondary | ICD-10-CM | POA: Insufficient documentation

## 2022-03-26 DIAGNOSIS — J020 Streptococcal pharyngitis: Secondary | ICD-10-CM | POA: Diagnosis not present

## 2022-03-26 DIAGNOSIS — J029 Acute pharyngitis, unspecified: Secondary | ICD-10-CM | POA: Diagnosis present

## 2022-03-26 DIAGNOSIS — Z20822 Contact with and (suspected) exposure to covid-19: Secondary | ICD-10-CM | POA: Insufficient documentation

## 2022-03-26 LAB — SARS CORONAVIRUS 2 BY RT PCR: SARS Coronavirus 2 by RT PCR: NEGATIVE

## 2022-03-26 LAB — GROUP A STREP BY PCR: Group A Strep by PCR: DETECTED — AB

## 2022-03-26 MED ORDER — CEFUROXIME AXETIL 250 MG PO TABS: 250.0000 mg | ORAL_TABLET | Freq: Two times a day (BID) | ORAL | 0 refills | Status: DC

## 2022-03-26 MED ORDER — DEXAMETHASONE 4 MG PO TABS
10.0000 mg | ORAL_TABLET | Freq: Once | ORAL | Status: AC
  Administered 2022-03-26: 10 mg via ORAL
  Filled 2022-03-26: qty 3

## 2022-03-26 MED ORDER — IBUPROFEN 400 MG PO TABS: 600.0000 mg | ORAL_TABLET | Freq: Once | ORAL | Status: DC

## 2022-03-26 MED ORDER — LIDOCAINE VISCOUS HCL 2 % MT SOLN
15.0000 mL | Freq: Once | OROMUCOSAL | Status: AC
  Administered 2022-03-26: 15 mL via OROMUCOSAL
  Filled 2022-03-26: qty 15

## 2022-03-26 NOTE — ED Triage Notes (Signed)
Pt arrives to ED with c/o sore throat for three days.

## 2022-03-26 NOTE — ED Notes (Signed)
Discharge paperwork given and verbally understood. 

## 2022-03-26 NOTE — Discharge Instructions (Addendum)
Note your visit today was overall positive for group A strep pharyngitis.  We will treat this with 10 days of antibiotics called Ceftin to take twice a day for the next 10 days.  You can take ibuprofen as needed for pain/inflammation as well as Cepacol throat lozenges.  Close follow-up with PCP recommended in 2 to 3 days for reevaluation.  Please not hesitate to return to the emergency department the worrisome signs and symptoms we discussed become apparent.

## 2022-03-26 NOTE — ED Provider Notes (Signed)
[facility] EMERGENCY DEPT Provider Note   CSN: [id number] Arrival date & time: 03/26/22  1335     History  Chief Complaint  Patient presents with   Sore Throat    Tanya May is a 21 y.o. female.   Sore Throat   21 year old female presents emergency department with complaints of sore throat.  Patient states that symptoms began approximately 2 days ago.  She notes no associated fever, cough, chest pain, shortness of breath.  She does states that she has had some nasal congestion.  Reports that her younger sister has been sick over the past week but denies any other known sick exposure.  She states she has been able to eat and drink but with pain.  Denies any difficulty breathing.  She reports emergency department for further evaluation.  Patient states she is not concerned for oral STD/STI.  Last menstrual period was 4 days ago.  Past medical history significant for seasonal allergies, bacterial vaginitis  Home Medications Prior to Admission medications   Medication Sig Start Date End Date Taking? Authorizing Provider  cefUROXime (CEFTIN) 250 MG tablet Take 1 tablet (250 mg total) by mouth 2 (two) times daily with a meal. 03/26/22  Yes Dion Saucier A, PA  cetirizine (ZYRTEC) 10 MG tablet Take 10 mg by mouth at bedtime.    [provider]      Allergies    Adhesive [tape], Amoxicillin, Latex, and Penicillins    Review of Systems   Review of Systems  All other systems reviewed and are negative.   Physical Exam Updated Vital Signs BP 127/74 (BP Location: Right Arm)   Pulse 85   Temp 98.1 F (36.7 C) (Temporal)   Resp 18   Ht 5\' 4"  (1.626 m)   Wt 54.4 kg   SpO2 99%   BMI 20.60 kg/m  Physical Exam Vitals and nursing note reviewed.  Constitutional:      General: She is not in acute distress.    Appearance: She is well-developed.  HENT:     Head: Normocephalic and atraumatic.     Right Ear: Tympanic membrane normal.     Left Ear:  Tympanic membrane normal.     Nose: Congestion present.     Mouth/Throat:     Pharynx: Posterior oropharyngeal erythema present.     Comments: Moderate posterior pharyngeal erythema noted with tonsils 1-2+ bilaterally with no obvious exudate.  Uvula midline and rises symmetrically with phonation.  No edema noted beneath the tongue or submandibular swelling.  Dentition in good repair. Eyes:     Conjunctiva/sclera: Conjunctivae normal.  Cardiovascular:     Rate and Rhythm: Normal rate and regular rhythm.     Heart sounds: No murmur heard. Pulmonary:     Effort: Pulmonary effort is normal. No respiratory distress.     Breath sounds: Normal breath sounds.  Abdominal:     Palpations: Abdomen is soft.     Tenderness: There is no abdominal tenderness.  Musculoskeletal:        General: No swelling.     Cervical back: Neck supple.  Skin:    General: Skin is warm and dry.     Capillary Refill: Capillary refill takes less than 2 seconds.  Neurological:     Mental Status: She is alert.  Psychiatric:        Mood and Affect: Mood normal.     ED Results / Procedures / Treatments   Labs (all labs ordered are listed, but only abnormal  results are displayed) Labs Reviewed  GROUP A STREP BY PCR - Abnormal; Notable for the following components:      Result Value   Group A Strep by PCR DETECTED (*)    All other components within normal limits  SARS CORONAVIRUS 2 BY RT PCR    EKG None  Radiology No results found.  Procedures Procedures    Medications Ordered in ED Medications  dexamethasone (DECADRON) tablet 10 mg (10 mg Oral Given 03/26/22 1447)  lidocaine (XYLOCAINE) 2 % viscous mouth solution 15 mL (15 mLs Mouth/Throat Given 03/26/22 1447)    ED Course/ Medical Decision Making/ A&P                           Medical Decision Making  This patient presents to the ED for concern of sore throat, this involves an extensive number of treatment options, and is a complaint that carries  with it a high risk of complications and morbidity.  The differential diagnosis includes Ludwig angina, Lemierre's disease peritonsillar abscess, viral pharyngitis, mononucleosis, chlamydia/gonorrhea, retropharyngeal abscess   Co morbidities that complicate the patient evaluation  See HPI   Additional history obtained:  Additional history obtained from EMR  Lab Tests:  Positive for group A strep.  Negative for coronavirus.   Imaging Studies ordered:  N/a   Cardiac Monitoring: / EKG:  The patient was maintained on a cardiac monitor.  I personally viewed and interpreted the cardiac monitored which showed an underlying rhythm of: Sinus rhythm   Consultations Obtained:  N/a   Problem List / ED Course / Critical interventions / Medication management  Sore throat I ordered medication including ibuprofen, dexamethasone and viscous lidocaine for sore throat.   Reevaluation of the patient after these medicines showed that the patient improved I have reviewed the patients home medicines and have made adjustments as needed   Social Determinants of Health:  Denies alcohol, illicit drug use   Test / Admission - Considered:  Strep pharyngitis Vitals signs within normal range and stable throughout visit. Laboratory/imaging studies significant for: See above Patient symptoms likely secondary to strep pharyngitis as indicated above.  She was treated with 1 dose of dexamethasone while in the emergency department.  She will be prescribed 10 days of oral antibiotics in the form of cefuroxime given her known amoxicillin allergy.  No indication for Ludwig angina or Lemierre's disease at this time.  Patient tolerating oral foods and liquids well in the emergency department.  Recommended symptomatic therapy with NSAIDs while at home as well as Cepacol throat lozenges.  Close follow-up with PCP recommended in 2 to 3 days for reevaluation.  Treatment plan was discussed at length with patient  she knowledge understanding was agreeable to said plan. Worrisome signs and symptoms were discussed with the patient, and the patient acknowledged understanding to return to the ED if noticed. Patient was stable upon discharge.         Final Clinical Impression(s) / ED Diagnoses Final diagnoses:  Strep pharyngitis    Rx / DC Orders ED Discharge Orders          Ordered    cefUROXime (CEFTIN) 250 MG tablet  2 times daily with meals        03/26/22 1448              Wilnette Kales, [place] 03/26/22 1449    Dorie Rank, MD 03/29/22 [phone]

## 2022-03-29 ENCOUNTER — Encounter: Payer: Self-pay | Admitting: Internal Medicine

## 2022-03-29 ENCOUNTER — Ambulatory Visit (INDEPENDENT_AMBULATORY_CARE_PROVIDER_SITE_OTHER): Payer: Managed Care, Other (non HMO) | Admitting: Internal Medicine

## 2022-03-29 ENCOUNTER — Other Ambulatory Visit: Payer: Self-pay

## 2022-03-29 VITALS — BP 113/62 | HR 68 | Temp 98.0°F | Ht 64.0 in | Wt 128.2 lb

## 2022-03-29 DIAGNOSIS — J020 Streptococcal pharyngitis: Secondary | ICD-10-CM | POA: Diagnosis not present

## 2022-03-29 MED ORDER — AZITHROMYCIN 500 MG PO TABS: 500.0000 mg | ORAL_TABLET | Freq: Every day | ORAL | 0 refills | Status: AC

## 2022-03-29 MED ORDER — LIDOCAINE VISCOUS HCL 2 % MT SOLN: 15.0000 mL | Freq: Four times a day (QID) | OROMUCOSAL | 0 refills | Status: AC | PRN

## 2022-03-29 NOTE — Patient Instructions (Addendum)
Ms. Allaire,  It was a pleasure to care for you today. I am sorry that you are feeling poorly!  Because you are having tongue swelling with your current antibiotic I will send you a different medicine called azithromycin that you will take one time daily for 5 days. Continue doing things like warm beverages, throat lozenges for your sore throat.   I would also like you to take Zyrtec and Benedryl twice a day for 3 days.  I would like you to contact the clinic towards the end of this week if you don't feel better after starting this new medicine. I would also like you to schedule an appointment for an annual check-up to make sure you are up to date on any tests and labs.  My best, Dr. Marlou Sa

## 2022-03-29 NOTE — Addendum Note (Signed)
Addended by: Renato Battles on: 03/29/2022 03:15 PM   Modules accepted: Orders

## 2022-03-29 NOTE — Assessment & Plan Note (Addendum)
Patient presented to urgent care 09/22 and was found to have strep throat. Due to a penicillin allergy, she was started on a 10-day course of cefuroxime. She did take this twice daily as prescribed for 3 days but noted a sensation of tongue swelling that began when she started the medication and persisted over the weekend. She has not taken the medication today.   She fortunately is able to maintain her airway and is not having trouble managing her secretions. At this time her main complaint is throat pain that makes swallowing difficult in addition to R ear pain and fullness. She has minimal sinus drainage. She denies fever, cough, lymphadenopathy, abdominal swelling, fatigue.  Conservative therapies for her sore throat including hot teas, throat lozenges have not provided symptom relief. Assessment:Exam remarkable for erythematous oropharynx without exudate. No lymphadenopathy appreciated. Ear exam unremarkable bilaterally. Likely that ear pain is related to infectious process/throat inflammation.  Plan:Will discontinue cefuroxime and place this on her allergy list. Azithromycin 500 mg daily for 5 days prescribed. I have also advised patient to take Zyrtec and Benadryl twice daily for 3 days to help mitigate sensation of tongue swelling which may be due to cefuroxime hypersensitivity.

## 2022-03-29 NOTE — Progress Notes (Signed)
   CC: strep throat with tongue swelling  HPI:  [name] is a 21 y.o. person with past medical history as detailed below who presents today complaining of persistent sore throat despite three days of antibiotic therapy for strep pharyngitis in addition to sensation of tongue swelling since starting cefuroxime. Please see problem based charting for detailed assessment and plan.  Past Medical History:  Diagnosis Date   Bacterial vaginitis 03/20/2018   Premature baby    born at 11 weeks, 1 week NICU stay   Seasonal allergies    Review of Systems:  Negative unless otherwise stated.  Physical Exam:  Vitals:   03/29/22 0937  BP: 113/62  Pulse: 68  Temp: 98 F (36.7 C)  TempSrc: Oral  SpO2: 99%  Weight: 128 lb 3.2 oz (58.2 kg)  Height: 5\' 4"  (1.626 m)   Constitutional:Well-appearing female, in no acute distress. HENT:No sinus tenderness. Nasal turbinates with edema and erythema bilaterally. Oropharynx with erythema but no exudates. No tongue swelling appreciated. TM normal to bilateral ears with no edema of ear canals. Neck:Negative for lymphadenopathy. Cardio:Regular rate and rhythm. No murmurs, rubs, or gallops. Pulm:Clear to auscultation bilaterally. Normal work of breathing on room air. Abdomen:Soft, nontender, nondistended. VWP:[id number] for extremity edema. Skin:Warm and dry. Neuro:Alert and oriented x3. No focal deficit noted. Psych:Pleasant mood and affect.   Assessment & Plan:   See Encounters Tab for problem based charting. Strep pharyngitis Patient presented to urgent care 09/22 and was found to have strep throat. Due to a penicillin allergy, she was started on a 10-day course of cefuroxime. She did take this twice daily as prescribed for 3 days but noted a sensation of tongue swelling that began when she started the medication and persisted over the weekend. She has not taken the medication today.   She fortunately is able to maintain her airway and is not  having trouble managing her secretions. At this time her main complaint is throat pain that makes swallowing difficult in addition to R ear pain and fullness. She has minimal sinus drainage. She denies fever, cough, lymphadenopathy, abdominal swelling, fatigue. Assessment:Exam remarkable for erythematous oropharynx without exudate. No lymphadenopathy appreciated. Ear exam unremarkable bilaterally. Likely that ear pain is related to infectious process/throat inflammation.  Conservative therapies for her sore throat including hot teas, throat lozenges have not provided symptom relief. Plan:Will discontinue cefuroxime and place this on her allergy list. Azithromycin 500 mg daily for 5 days prescribed. I have also advised patient to take Zyrtec and Benadryl twice daily for 3 days to help mitigate sensation of tongue swelling which may be due to cefuroxime hypersensitivity.  Patient discussed with Dr. Daryll Drown

## 2022-04-01 NOTE — Progress Notes (Signed)
Internal Medicine Clinic Attending  Case discussed with Dr. Dean  at the time of the visit.  We reviewed the resident's history and exam and pertinent patient test results.  I agree with the assessment, diagnosis, and plan of care documented in the resident's note.  

## 2022-06-14 ENCOUNTER — Ambulatory Visit (INDEPENDENT_AMBULATORY_CARE_PROVIDER_SITE_OTHER): Payer: Managed Care, Other (non HMO) | Admitting: Internal Medicine

## 2022-06-14 DIAGNOSIS — U071 COVID-19: Secondary | ICD-10-CM

## 2022-06-14 NOTE — Progress Notes (Signed)
  [facility] Residency Telephone Encounter Continuity Care Appointment  HPI:  This telephone encounter was created for Ms. Tanya May on 06/14/2022 for the following purpose/cc: COVID+   Past Medical History:  Past Medical History:  Diagnosis Date   Bacterial vaginitis 03/20/2018   Premature baby    born at 32 weeks, 1 week NICU stay   Seasonal allergies      ROS:  .Please see problem-based list for further details, assessments, and plans.   Assessment / Plan / Recommendations:  Please see A&P under problem oriented charting for assessment of the patient's acute and chronic medical conditions.  As always, pt is advised that if symptoms worsen or new symptoms arise, they should go to an urgent care facility or to to ER for further evaluation.   Consent and Medical Decision Making:  Patient discussed with Dr. Antony Contras This is a telephone encounter between Tanya May and Chauncey Mann on 06/14/2022 for COVID. The visit was conducted with the patient located at home and Chauncey Mann at [facility]. The patient's identity was confirmed using their DOB and current address. The patient has consented to being evaluated through a telephone encounter and understands the associated risks (an examination cannot be done and the patient may need to come in for an appointment) / benefits (allows the patient to remain at home, decreasing exposure to coronavirus). I personally spent 11 minutes on medical discussion.   COVID-19 The patient presents via telehealth visit today to discuss positive COVID-19 test.  The patient states that 2 nights ago she felt like she had trouble breathing with night sweats and chills.  She was also feeling nauseous at this time and had a headache.  She took a COVID test at home (was expired) and tested positive.  The next day the patient picked up a new COVID test, but this one was negative.  She denies any sick contacts and notes that she had 2 doses  of the COVID-vaccine previously.  Now, the patient states that she is already feeling better.  She denies any fevers, chills, headache, cough, sore throat, or shortness of breath.  She has been staying hydrated, but states that she has had little appetite. Her nausea is still present, but improved compared to two nights ago. She denies any vomiting, abd pain, or diarrhea. Because she was not feeling well yesterday, she missed work and will need a work excuse.  Overall, given that the patient's symptoms are already improving/resolving, will not treat her with antiviral therapy at this time.  Although the patient's second COVID test was negative, I suspect that she does have a true COVID-19 infection, given her symptoms.   Plan: -Continue supportive therapy -If patient's symptoms get worse, advised her to call back -Provided work note

## 2022-06-14 NOTE — Assessment & Plan Note (Addendum)
The patient presents via telehealth visit today to discuss positive COVID-19 test.  The patient states that 2 nights ago she felt like she had trouble breathing with night sweats and chills.  She was also feeling nauseous at this time and had a headache.  She took a COVID test at home (was expired) and tested positive.  The next day the patient picked up a new COVID test, but this one was negative.  She denies any sick contacts and notes that she had 2 doses of the COVID-vaccine previously.  Now, the patient states that she is already feeling better.  She denies any fevers, chills, headache, cough, sore throat, or shortness of breath.  She has been staying hydrated, but states that she has had little appetite. Her nausea is still present, but improved compared to two nights ago. She denies any vomiting, abd pain, or diarrhea. Because she was not feeling well yesterday, she missed work and will need a work excuse.  Overall, given that the patient's symptoms are already improving/resolving, will not treat her with antiviral therapy at this time.  Although the patient's second COVID test was negative, I suspect that she does have a true COVID-19 infection, given her symptoms.   Plan: -Continue supportive therapy -If patient's symptoms get worse, advised her to call back -Provided work note

## 2022-06-14 NOTE — Progress Notes (Signed)
Internal Medicine Clinic Attending ° °Case discussed with Dr. Atway  At the time of the visit.  We reviewed the resident’s history and exam and pertinent patient test results.  I agree with the assessment, diagnosis, and plan of care documented in the resident’s note.  °

## 2023-11-24 ENCOUNTER — Ambulatory Visit: Payer: Self-pay | Admitting: Student

## 2023-11-24 VITALS — BP 119/65 | HR 63 | Ht 64.0 in | Wt 128.2 lb

## 2023-11-24 DIAGNOSIS — R58 Hemorrhage, not elsewhere classified: Secondary | ICD-10-CM

## 2023-11-24 DIAGNOSIS — Z111 Encounter for screening for respiratory tuberculosis: Secondary | ICD-10-CM

## 2023-11-24 NOTE — Patient Instructions (Signed)
 It was a pleasure taking care of you today!    I have placed a referral to OB as you requested.  2.  Please make sure your skin test test read between 48 to 72 hours.    I have ordered the following labs for you:   Lab Orders         PPD        Should you have any questions or concerns please call the internal medicine clinic at [phone].     Marni Sins, MD  [facility]

## 2023-11-24 NOTE — Progress Notes (Deleted)
   CC: ***  HPI:  [name] is a 23 y.o. female with no pertinent past medical history who presents for concerns of abdominal bleeding.  Please see assessment and plan for full HPI.  Medications: Allergies: Zyrtec 10 mg nightly  Patient has not been seen in well over a year.    Past Medical History:  Diagnosis Date   Bacterial vaginitis 03/20/2018   Premature baby    born at 32 weeks, 1 week NICU stay   Seasonal allergies      Current Outpatient Medications:    cetirizine (ZYRTEC) 10 MG tablet, Take 10 mg by mouth at bedtime., Disp: , Rfl:    lidocaine  (XYLOCAINE ) 2 % solution, Use as directed 15 mLs in the mouth or throat every 6 (six) hours as needed for mouth pain., Disp: 100 mL, Rfl: 0  Review of Systems:  ***  Constitutional: Eye: Respiratory: Cardiovascular: GI: MSK: GU: Skin: Neuro: Endocrine:   Physical Exam:  There were no vitals filed for this visit. *** General: Patient is sitting comfortably in the room  Eyes: Pupils equal and reactive to light, EOM intact  Head: Normocephalic, atraumatic  Neck: Supple, nontender, full range of motion, No JVD Cardio: Regular rate and rhythm, no murmurs, rubs or gallops. 2+ pulses to bilateral upper and lower extremities  Chest: No chest tenderness Pulmonary: Clear to ausculation bilaterally with no rales, rhonchi, and crackles  Abdomen: Soft, nontender with normoactive bowel sounds with no rebound or guarding  Neuro: Alert and orientated x3. CN II-XII intact. Sensation intact to upper and lower extremities. 2+ patellar reflex.  Back: No midline tenderness, no step off or deformities noted. No paraspinal muscle tenderness.  Skin: No rashes noted  MSK: 5/5 strength to upper and lower extremities.    Assessment & Plan:   No problem-specific Assessment & Plan notes found for this encounter.    Patient {GC/GE:3044014::"discussed with","seen with"} Dr.  {NAMES:[id number]::"Guilloud","Hoffman","Mullen","Narendra","Williams","Vincent"}  Jonelle Neri, DO PGY-2 Internal Medicine Resident

## 2023-11-25 DIAGNOSIS — Z111 Encounter for screening for respiratory tuberculosis: Secondary | ICD-10-CM | POA: Insufficient documentation

## 2023-11-25 DIAGNOSIS — R58 Hemorrhage, not elsewhere classified: Secondary | ICD-10-CM | POA: Insufficient documentation

## 2023-11-25 NOTE — Progress Notes (Signed)
.   CC: Acute visit for abnormal bleeding.  HPI:  [name] is a 23 y.o. female living with a history stated below and presents today for abnormal vaginal bleeding.  She is presenting with a 1 day history of vaginal spotting, on 5/14.  Has never happened to her, panic, decided to call clinic to make a follow-up appointment.  A day later, she had a normal menstrual flow, which ended 3 days ago.  Denies any gum bleeding, or history of heavy menstrual bleeding.  He is a Consulting civil engineer, and will like to get his TB skin test placed.     Please see problem based assessment and plan for additional details.  Past Medical History:  Diagnosis Date   Bacterial vaginitis 03/20/2018   Premature baby    born at 32 weeks, 1 week NICU stay   Seasonal allergies     Current Outpatient Medications on File Prior to Visit  Medication Sig Dispense Refill   cetirizine (ZYRTEC) 10 MG tablet Take 10 mg by mouth at bedtime.     lidocaine  (XYLOCAINE ) 2 % solution Use as directed 15 mLs in the mouth or throat every 6 (six) hours as needed for mouth pain. 100 mL 0   No current facility-administered medications on file prior to visit.     Review of Systems: ROS negative except for what is noted on the assessment and plan.  Vitals:   11/24/23 1610  BP: 119/65  Pulse: 63  SpO2: 99%  Weight: 128 lb 3.2 oz (58.2 kg)  Height: 5\' 4"  (1.626 m)    Physical Exam: Constitutional: Sitting in the chair Cardiovascular: regular rate and rhythm, no m/r/g Pulmonary/Chest: normal work of breathing on room air, lungs clear to auscultation bilaterally Abdominal: soft, non-tender, non-distended GU: Deferred exam..   Assessment & Plan:   Patient discussed with Dr. Lelia Putnam  Bleeding Vaginal spotting has resolved.  HDS.  No mucosal bleeding on exam.  Patient is requesting referral to OB/GYN for regular Pap smear.   Plan: - Referral placed.  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  Denies chronic cough or night sweats. Patient's aunt is a nurse would help with reading of the results in 48-72 hours.  Plan: -Continue with the plan as above.  Tanya Sins, MD [facility] Internal Medicine, PGY-1 Pager: [phone] Date 11/25/2023 Time 6:00 PM

## 2023-11-25 NOTE — Assessment & Plan Note (Signed)
 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  Denies chronic cough or night sweats. Patient's aunt is a nurse would help with reading of the results in 48-72 hours.  Plan: -Continue with the plan as above.

## 2023-11-25 NOTE — Assessment & Plan Note (Signed)
 Vaginal spotting has resolved.  HDS.  No mucosal bleeding on exam.  Patient is requesting referral to OB/GYN for regular Pap smear.   Plan: - Referral placed.

## 2023-11-29 NOTE — Progress Notes (Signed)
 Internal Medicine Clinic Attending  Case discussed with the resident at the time of the visit.  We reviewed the resident's history and exam and pertinent patient test results.  I agree with the assessment, diagnosis, and plan of care documented in the resident's note.

## 2024-02-28 ENCOUNTER — Encounter: Payer: Self-pay | Admitting: *Deleted
